# Patient Record
Sex: Female | Born: 1990 | Race: White | Hispanic: No | Marital: Single | Smoking: Never smoker
Health system: Southern US, Community
[De-identification: ages and names within clinical notes are randomized; demographics above are authoritative.]

## PROBLEM LIST (undated history)

## (undated) DIAGNOSIS — D497 Neoplasm of unspecified behavior of endocrine glands and other parts of nervous system: Secondary | ICD-10-CM

## (undated) HISTORY — PX: TYMPANOSTOMY TUBE PLACEMENT: SHX32

## (undated) HISTORY — PX: FOOT SURGERY: SHX648

## (undated) HISTORY — PX: NASAL SINUS SURGERY: SHX719

---

## 2018-05-20 ENCOUNTER — Encounter (HOSPITAL_COMMUNITY): Payer: Self-pay | Admitting: Emergency Medicine

## 2018-05-20 ENCOUNTER — Emergency Department (HOSPITAL_COMMUNITY)
Admission: EM | Admit: 2018-05-20 | Discharge: 2018-05-20 | Disposition: A | Discharge status: Home / Self Care | Payer: PRIVATE HEALTH INSURANCE | Attending: Emergency Medicine | Admitting: Emergency Medicine

## 2018-05-20 ENCOUNTER — Emergency Department (HOSPITAL_COMMUNITY): Payer: PRIVATE HEALTH INSURANCE

## 2018-05-20 DIAGNOSIS — G43809 Other migraine, not intractable, without status migrainosus: Secondary | ICD-10-CM | POA: Diagnosis not present

## 2018-05-20 DIAGNOSIS — Z86018 Personal history of other benign neoplasm: Secondary | ICD-10-CM | POA: Diagnosis not present

## 2018-05-20 DIAGNOSIS — R51 Headache: Secondary | ICD-10-CM | POA: Diagnosis present

## 2018-05-20 HISTORY — DX: Neoplasm of unspecified behavior of endocrine glands and other parts of nervous system: D49.7

## 2018-05-20 MED ORDER — METOCLOPRAMIDE HCL 10 MG PO TABS: 10.0000 mg | ORAL_TABLET | Freq: Four times a day (QID) | ORAL | 0 refills | Status: DC

## 2018-05-20 MED ORDER — DEXAMETHASONE SODIUM PHOSPHATE 10 MG/ML IJ SOLN
10.0000 mg | Freq: Once | INTRAMUSCULAR | Status: AC
  Administered 2018-05-20: 10 mg via INTRAVENOUS
  Filled 2018-05-20: qty 1

## 2018-05-20 MED ORDER — TOPIRAMATE 25 MG PO TABS: 25.0000 mg | ORAL_TABLET | Freq: Two times a day (BID) | ORAL | 0 refills | Status: DC

## 2018-05-20 MED ORDER — PROCHLORPERAZINE EDISYLATE 10 MG/2ML IJ SOLN
10.0000 mg | INTRAMUSCULAR | Status: AC
  Administered 2018-05-20: 10 mg via INTRAVENOUS
  Filled 2018-05-20: qty 2

## 2018-05-20 MED ORDER — KETOROLAC TROMETHAMINE 15 MG/ML IJ SOLN
15.0000 mg | Freq: Once | INTRAMUSCULAR | Status: AC
  Administered 2018-05-20: 15 mg via INTRAVENOUS
  Filled 2018-05-20: qty 1

## 2018-05-20 MED ORDER — SODIUM CHLORIDE 0.9 % IV BOLUS (SEPSIS)
1000.0000 mL | Freq: Once | INTRAVENOUS | Status: AC
  Administered 2018-05-20: 1000 mL via INTRAVENOUS

## 2018-05-20 NOTE — Discharge Instructions (Signed)
Your CT scan of your head was negative for any new or acute problem. Thank you for allowing me to care for you today. Please return to the emergency department if you have new or worsening symptoms. Take your medications as instructed.

## 2018-05-20 NOTE — ED Notes (Signed)
Patient transported to CT 

## 2018-05-20 NOTE — ED Triage Notes (Signed)
Pt c/o frontal migraine since Tuesday and blurred vision since Wed. 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 Pt is sensitive to sounds.

## 2018-05-20 NOTE — ED Notes (Signed)
ED Provider at bedside. 

## 2018-05-21 NOTE — ED Provider Notes (Signed)
[facility] DEPT Provider Note   CSN: [id number] Arrival date & time: 05/20/18  1842    History   Chief Complaint Chief Complaint  Patient presents with  . Migraine  . Blurred Vision    HPI Julie Crawford is a 28 y.o. female.     Patient is a 28 year old female with past medical history of a pituitary tumor, migraines who presents to the emergency department for migraine headache.  She reports that this is been going on for the last couple of days.  Reports that this is similar to migraines he has had in the past.  Reports that she used to see neurology for her migraines and for monitoring of her pituitary tumor but she has not seen them in some time because that was when she lived in [place].  Reports that she just moved to the area and needs a new neurologist.  Reports that the migraine is frontal and associated with photophobia, phonophobia and blurry vision.  Reports over-the-counter medications are not working.  Denies any numbness, tingling, weakness, slurred speech     Past Medical History:  Diagnosis Date  . Pituitary tumor     There are no active problems to display for this patient.   Past Surgical History:  Procedure Laterality Date  . FOOT SURGERY Right   . NASAL SINUS SURGERY    . TYMPANOSTOMY TUBE PLACEMENT       OB History   No obstetric history on file.      Home Medications    Prior to Admission medications   Medication Sig Start Date End Date Taking? Authorizing Provider  aspirin-acetaminophen-caffeine (EXCEDRIN MIGRAINE) [phone] MG tablet Take 2 tablets by mouth every 6 (six) hours as needed for headache or migraine.   Yes [provider]  metoCLOPramide (REGLAN) 10 MG tablet Take 1 tablet (10 mg total) by mouth every 6 (six) hours. 05/20/18   Alveria Apley, PA-C  topiramate (TOPAMAX) 25 MG tablet Take 1 tablet (25 mg total) by mouth 2 (two) times daily for 14 days. 05/20/18 06/03/18  Alveria Apley, PA-C      Family History No family history on file.  Social History Social History   Tobacco Use  . Smoking status: Never Smoker  . Smokeless tobacco: Never Used  Substance Use Topics  . Alcohol use: Never    Frequency: Never  . Drug use: Not on file     Allergies   Benadryl [diphenhydramine]; Zofran [ondansetron hcl]; and Sumatriptan succinate   Review of Systems Review of Systems  Constitutional: Negative for chills and fever.  HENT: Negative for ear pain and sore throat.   Eyes: Positive for photophobia and visual disturbance. Negative for pain, discharge, redness and itching.  Respiratory: Negative for cough and shortness of breath.   Cardiovascular: Negative for chest pain and palpitations.  Gastrointestinal: Negative for abdominal pain, diarrhea, nausea and vomiting.  Genitourinary: Negative for dysuria and hematuria.  Musculoskeletal: Negative for arthralgias and back pain.  Skin: Negative for color change and rash.  Neurological: Positive for headaches. Negative for dizziness, tremors, seizures, syncope, facial asymmetry, speech difficulty, weakness and light-headedness.  All other systems reviewed and are negative.    Physical Exam Updated Vital Signs BP 111/77 (BP Location: Right Arm)   Pulse 81   Temp 98.7 F (37.1 C) (Oral)   Resp 17   Ht 5\' 4"  (1.626 m)   Wt 70.3 kg   LMP 05/20/2018   SpO2 99%   BMI  26.61 kg/m   Physical Exam Vitals signs and nursing note reviewed.  Constitutional:      Appearance: Normal appearance.  HENT:     Head: Normocephalic.     Right Ear: Tympanic membrane normal.     Left Ear: Tympanic membrane normal.     Nose: Nose normal.     Mouth/Throat:     Mouth: Mucous membranes are moist.     Pharynx: Oropharynx is clear.  Eyes:     Conjunctiva/sclera: Conjunctivae normal.  Cardiovascular:     Rate and Rhythm: Normal rate and regular rhythm.  Pulmonary:     Effort: Pulmonary effort is normal.  Skin:    General: Skin is  dry.  Neurological:     General: No focal deficit present.     Mental Status: She is alert and oriented to person, place, and time.     Cranial Nerves: No cranial nerve deficit.     Sensory: No sensory deficit.     Motor: No weakness.     Coordination: Coordination normal.     Gait: Gait normal.     Deep Tendon Reflexes: Reflexes normal.  Psychiatric:        Mood and Affect: Mood normal.      ED Treatments / Results  Labs (all labs ordered are listed, but only abnormal results are displayed) Labs Reviewed - No data to display  EKG None  Radiology Ct Head Wo Contrast  Result Date: 05/20/2018 CLINICAL DATA:  Migraine headache. EXAM: CT HEAD WITHOUT CONTRAST TECHNIQUE: Contiguous axial images were obtained from the base of the skull through the vertex without intravenous contrast. COMPARISON:  None. FINDINGS: Brain: No acute intracranial abnormality. Specifically, no hemorrhage, hydrocephalus, mass lesion, acute infarction, or significant intracranial injury. Vascular: No hyperdense vessel or unexpected calcification. Skull: No acute calvarial abnormality. Sinuses/Orbits: Visualized paranasal sinuses and mastoids clear. Orbital soft tissues unremarkable. Other: None IMPRESSION: Normal study. Electronically Signed   By: Rolm Baptise M.D.   On: 05/20/2018 21:53    Procedures Procedures (including critical care time)  Medications Ordered in ED Medications  ketorolac (TORADOL) 15 MG/ML injection 15 mg (15 mg Intravenous Given 05/20/18 2118)  dexamethasone (DECADRON) injection 10 mg (10 mg Intravenous Given 05/20/18 2117)  sodium chloride 0.9 % bolus 1,000 mL (0 mLs Intravenous Stopped 05/20/18 2250)  prochlorperazine (COMPAZINE) injection 10 mg (10 mg Intravenous Given 05/20/18 2117)     Initial Impression / Assessment and Plan / ED Course  I have reviewed the triage vital signs and the nursing notes.  Pertinent labs & imaging results that were available during my care of the  patient were reviewed by me and considered in my medical decision making (see chart for details).  Clinical Course as of May 22 2307  Nancy Fetter May 20, 2018  2237 Patient is improved with fluids, toradol, compazine. CT negative.  There are no neurological findings on exam.  Will send home with medication for migraine and refer her to new neurologist.    [KM]    Clinical Course User Index [KM] Alveria Apley, PA-C         Final Clinical Impressions(s) / ED Diagnoses   Final diagnoses:  Other migraine without status migrainosus, not intractable  History of benign pituitary tumor    ED Discharge Orders         Ordered    metoCLOPramide (REGLAN) 10 MG tablet  Every 6 hours     05/20/18 2258    topiramate (TOPAMAX) 25 MG  tablet  2 times daily     05/20/18 2258           Kristine Royal 05/21/18 2310    Margette Fast, MD 05/22/18 1017

## 2018-05-21 NOTE — Progress Notes (Signed)
NEUROLOGY CONSULTATION NOTE  Julie Crawford MRN: [id number] DOB: 03-11-1991  Referring provider: Marda Stalker, MD (ED referral) Primary care provider: No PCP  Reason for consult:  migraines  HISTORY OF PRESENT ILLNESS: Julie Crawford is a 28 year old right-handed Caucasian woman with history of benign pituitary tumor who presents for migraines.  History supplemented by ED note.  She moved here from [place] in August.  Onset:  She started having migraines in 2010 in [place].  She had a 2 week intractable migraine.  Imaging demonstrated a benign pituitary tumor.  She underwent an LP which ruled out high intracranial pressure and meningitis.  She saw a neurosurgeon who did not perform surgery because he thought the risks were too high.  She was also followed by endocrinology, ophthalmology and neurology.  Since then, she has had a 3-4/10 migraine daily which would come and go.  She has had a constant intractable migraine for the past week.   Location:  Usually they are holocephalic but current migraine is center of frontal region. Quality:  Pressure and throbbing Intensity:  9/10 Aura:  no Premonitory Phase:  no Postdrome:  no Associated symptoms:  Photophobia, phonophobia, sometimes nausea.  She reports blurred/double vision.  She denies associated unilateral numbness or weakness. Duration:  constant Frequency:  constant Frequency of abortive medication: rarely Triggers:  None.  Unclear if hormonal, exertion Relieving factors:  none Activity:  aggravates  She was evaluated in the ED on 05/20/17 where CT head was performed, which was personally reviewed and was normal.  Current NSAIDS:  none Current analgesics:  Excedrin Migraine (1 or 2 days a week) Current triptans:  none Current ergotamine:  none Current anti-emetic:  none Current muscle relaxants:  none Current anti-anxiolytic:  none Current sleep aide:  none Current Antihypertensive medications:  none Current  Antidepressant medications:  none Current Anticonvulsant medications:  topiramate 25mg  twice daily (started on Sunday) Current anti-CGRP:  none Current Vitamins/Herbal/Supplements:  none Current Antihistamines/Decongestants:  none Other therapy:  none Hormone/birth control:  none Other medications:  none  Past NSAIDS:  Toradol, ibuprofen, naproxen Past analgesics:  Tylenol, maybe tramadol Past abortive triptans:  Sumatriptan (stiff neck, hives), Maxalt (also reaction) Past abortive ergotamine:  IV DHE in hospital Past muscle relaxants:  Unsure of names Past anti-emetic:  Reglan (face swelling and rash), Zofran (hives, anaphylaxis), promethazine (no side effect), compazine (possible side effect- tongue swelling) Past antihypertensive medications:  propranolol Past antidepressant medications:  nortriptyline Past anticonvulsant medications:  none Past anti-CGRP:  none Past vitamins/Herbal/Supplements:  none Past antihistamines/decongestants:  Benadryl (hives, anaphylaxis) Other past therapies:  none  Caffeine:  rarely Alcohol:  no Smoker:  no Diet:  60 oz water daily.  Tries to avoid soda.   Exercise:  Mild (over exertion causes headache Depression:  no; Anxiety:  no Other pain:  no Sleep hygiene:  varies Family history of headache:  no  PAST MEDICAL HISTORY: Past Medical History:  Diagnosis Date  . Pituitary tumor     PAST SURGICAL HISTORY: Past Surgical History:  Procedure Laterality Date  . FOOT SURGERY Right   . NASAL SINUS SURGERY    . TYMPANOSTOMY TUBE PLACEMENT      MEDICATIONS: Current Outpatient Medications on File Prior to Visit  Medication Sig Dispense Refill  . aspirin-acetaminophen-caffeine (EXCEDRIN MIGRAINE) 250-250-65 MG tablet Take 2 tablets by mouth every 6 (six) hours as needed for headache or migraine.    . metoCLOPramide (REGLAN) 10 MG tablet Take 1 tablet (10 mg total) by  mouth every 6 (six) hours. 30 tablet 0  . topiramate (TOPAMAX) 25 MG tablet  Take 1 tablet (25 mg total) by mouth 2 (two) times daily for 14 days. 28 tablet 0   No current facility-administered medications on file prior to visit.     ALLERGIES: Allergies  Allergen Reactions  . Benadryl [Diphenhydramine] Anaphylaxis  . Zofran [Ondansetron Hcl] Anaphylaxis  . Sumatriptan Succinate Hives    FAMILY HISTORY: No family history of headaches  SOCIAL HISTORY: Social History   Socioeconomic History  . Marital status: Single    Spouse name: Not on file  . Number of children: Not on file  . Years of education: Not on file  . Highest education level: Not on file  Occupational History  . Not on file  Social Needs  . Financial resource strain: Not on file  . Food insecurity:    Worry: Not on file    Inability: Not on file  . Transportation needs:    Medical: Not on file    Non-medical: Not on file  Tobacco Use  . Smoking status: Never Smoker  . Smokeless tobacco: Never Used  Substance and Sexual Activity  . Alcohol use: Never    Frequency: Never  . Drug use: Not on file  . Sexual activity: Not on file  Lifestyle  . Physical activity:    Days per week: Not on file    Minutes per session: Not on file  . Stress: Not on file  Relationships  . Social connections:    Talks on phone: Not on file    Gets together: Not on file    Attends religious service: Not on file    Active member of club or organization: Not on file    Attends meetings of clubs or organizations: Not on file    Relationship status: Not on file  . Intimate partner violence:    Fear of current or ex partner: Not on file    Emotionally abused: Not on file    Physically abused: Not on file    Forced sexual activity: Not on file  Other Topics Concern  . Not on file  Social History Narrative  . Not on file    REVIEW OF SYSTEMS: Constitutional: No fevers, chills, or sweats, no generalized fatigue, change in appetite Eyes: No visual changes, double vision, eye pain Ear, nose and  throat: No hearing loss, ear pain, nasal congestion, sore throat Cardiovascular: No chest pain, palpitations Respiratory:  No shortness of breath at rest or with exertion, wheezes GastrointestinaI: No nausea, vomiting, diarrhea, abdominal pain, fecal incontinence Genitourinary:  No dysuria, urinary retention or frequency Musculoskeletal:  No neck pain, back pain Integumentary: No rash, pruritus, skin lesions Neurological: as above Psychiatric: No depression, insomnia, anxiety Endocrine: No palpitations, fatigue, diaphoresis, mood swings, change in appetite, change in weight, increased thirst Hematologic/Lymphatic:  No purpura, petechiae. Allergic/Immunologic: no itchy/runny eyes, nasal congestion, recent allergic reactions, rashes  PHYSICAL EXAM: Blood pressure 108/74, pulse 85, height 5\' 4"  (1.626 m), weight 155 lb (70.3 kg), last menstrual period 05/20/2018, SpO2 99 %. General: No acute distress.  Patient appears well-groomed.   Head:  Normocephalic/atraumatic Eyes:  fundi examined but not visualized Neck: supple, no paraspinal tenderness, full range of motion Back: No paraspinal tenderness Heart: regular rate and rhythm Lungs: Clear to auscultation bilaterally. Vascular: No carotid bruits. Neurological Exam: Mental status: alert and oriented to person, place, and time, recent and remote memory intact, fund of knowledge intact, attention and concentration  intact, speech fluent and not dysarthric, language intact. Cranial nerves: CN I: not tested CN II: pupils equal, round and reactive to light, visual fields intact CN III, IV, VI:  full range of motion, no nystagmus, no ptosis CN V: facial sensation intact CN VII: upper and lower face symmetric CN VIII: hearing intact CN IX, X: gag intact, uvula midline CN XI: sternocleidomastoid and trapezius muscles intact CN XII: tongue midline Bulk & Tone: normal, no fasciculations. Motor:  5/5 throughout  Sensation:  temperature and  vibration sensation intact.  Deep Tendon Reflexes:  2+ throughout, toes downgoing.   Finger to nose testing:  Without dysmetria.   Heel to shin:  Without dysmetria.   Gait:  Normal station and stride.  Able to turn and tandem walk. Romberg negative.  IMPRESSION: 1.  Chronic migraine without aura, with status migrainosus, intractable 2.  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 MRI of brain with and without contrast with attention to pituitary gland 2. Refer to endocrinology and ophthalmology 3. To break current intractable migraine, prednisone taper.  4. For preventative management, continue to titrate topiramate to 50mg  twice daily 5.  For abortive therapy, will try Ubrelvy 50mg .  If ineffective, try 100mg .  Promethazine for nausea. 6.  Limit use of pain relievers to no more than 2 days out of week to prevent risk of rebound or medication-overuse headache. 7.  Keep headache diary 8.  Exercise, hydration, caffeine cessation, sleep hygiene, monitor for and avoid triggers 9.  Consider:  magnesium citrate 400mg  daily, riboflavin 400mg  daily, and coenzyme Q10 100mg  three times daily 10.  Follow up in 4 months  45 minutes spent face to face with patient, over 50% spent discussing management.  Metta Clines, DO

## 2018-05-23 ENCOUNTER — Encounter: Payer: Self-pay | Admitting: Neurology

## 2018-05-23 ENCOUNTER — Ambulatory Visit (INDEPENDENT_AMBULATORY_CARE_PROVIDER_SITE_OTHER): Payer: PRIVATE HEALTH INSURANCE | Admitting: Neurology

## 2018-05-23 ENCOUNTER — Other Ambulatory Visit (INDEPENDENT_AMBULATORY_CARE_PROVIDER_SITE_OTHER): Payer: PRIVATE HEALTH INSURANCE

## 2018-05-23 VITALS — BP 108/74 | HR 85 | Ht 64.0 in | Wt 155.0 lb

## 2018-05-23 DIAGNOSIS — D352 Benign neoplasm of pituitary gland: Secondary | ICD-10-CM | POA: Diagnosis not present

## 2018-05-23 DIAGNOSIS — Z79899 Other long term (current) drug therapy: Secondary | ICD-10-CM | POA: Diagnosis not present

## 2018-05-23 DIAGNOSIS — G43711 Chronic migraine without aura, intractable, with status migrainosus: Secondary | ICD-10-CM

## 2018-05-23 LAB — BASIC METABOLIC PANEL
BUN: 16 mg/dL (ref 6–23)
CO2: 26 mEq/L (ref 19–32)
Calcium: 9.3 mg/dL (ref 8.4–10.5)
Chloride: 105 mEq/L (ref 96–112)
Creatinine, Ser: 0.91 mg/dL (ref 0.40–1.20)
GFR: 74.22 mL/min (ref >60.00)
Glucose, Bld: 74 mg/dL (ref 70–99)
POTASSIUM: 3.8 meq/L (ref 3.5–5.1)
Sodium: 141 mEq/L (ref 135–145)

## 2018-05-23 MED ORDER — PREDNISONE 10 MG PO TABS: ORAL_TABLET | ORAL | 0 refills | Status: AC

## 2018-05-23 MED ORDER — PROMETHAZINE HCL 25 MG PO TABS: 25.0000 mg | ORAL_TABLET | Freq: Four times a day (QID) | ORAL | 2 refills | Status: AC | PRN

## 2018-05-23 MED ORDER — TOPIRAMATE 25 MG PO TABS: 50.0000 mg | ORAL_TABLET | Freq: Two times a day (BID) | ORAL | 3 refills | Status: AC

## 2018-05-23 MED ORDER — UBROGEPANT 50 MG PO TABS: 50.0000 mg | ORAL_TABLET | ORAL | 0 refills | Status: AC | PRN

## 2018-05-23 NOTE — Patient Instructions (Addendum)
1. To try and break current intractable migraine, take prednisone taper:  Take 6tabs x1day, then 5tabs x1day, then 4tabs x1day, then 3tabs x1day, then 2tabs x1day, then 1tab x1day, then STOP  2. For preventative management, we will continue to titrate up on topiramate.  On Sunday, start 1 tablet in morning and 2 tablets at bedtime for a week and then increase to 2 tablets twice daily 3.  For abortive therapy, try Ubrely.  Take 1 tablet.  May repeat in 2 hours if needed.  If not effective, contact us and I will prescribe the higher dose. 4.  Limit use of pain relievers to no more than 2 days out of week to prevent risk of rebound or medication-overuse headache. 5. Promethazine as needed for nausea 6.  Keep headache diary 7.  Exercise, hydration, caffeine cessation, sleep hygiene, monitor for and avoid triggers 8.  Consider:  magnesium citrate 400mg  daily, riboflavin 400mg  daily, and coenzyme Q10 100mg  three times daily 9.  We will get MRI of brain with and without contrast with attention to pituitary gland 10. I will refer you to endocrinology and ophthalmology 11. We will check a BMP lab today 12.  Follow up in 4 months.  We have sent a referral to [facility] for your MRI and they will call you directly to schedule your appt. They are located at [place]. If you need to contact them directly please call [phone].  Your provider has requested that you have labwork completed today. Please go to [facility] ([id number]) on the second floor of this building before leaving the office today. You do not need to check in. If you are not called within 15 minutes please check with the front desk.

## 2018-05-24 ENCOUNTER — Other Ambulatory Visit: Payer: Self-pay

## 2018-05-24 MED ORDER — UBROGEPANT 100 MG PO TABS: 100.0000 mg | ORAL_TABLET | ORAL | 3 refills | Status: AC | PRN

## 2018-05-29 ENCOUNTER — Telehealth: Payer: Self-pay

## 2018-05-29 NOTE — Telephone Encounter (Signed)
Patient stated she is returning a call from our office,. She received a phone call from you about the referral that was sent over. For her to call back to discuss the next step Please advise

## 2018-05-29 NOTE — Telephone Encounter (Signed)
Spoke with the patient-gave her the number to our medical records department so that she can get ROI faxed from Endocrinology in [place]

## 2018-06-03 ENCOUNTER — Ambulatory Visit
Admission: RE | Admit: 2018-06-03 | Discharge: 2018-06-03 | Disposition: A | Discharge status: Home / Self Care | Payer: PRIVATE HEALTH INSURANCE | Source: Ambulatory Visit | Attending: Neurology | Admitting: Neurology

## 2018-06-03 DIAGNOSIS — D352 Benign neoplasm of pituitary gland: Secondary | ICD-10-CM

## 2018-06-03 MED ORDER — GADOBENATE DIMEGLUMINE 529 MG/ML IV SOLN
9.0000 mL | Freq: Once | INTRAVENOUS | Status: AC | PRN
  Administered 2018-06-03: 9 mL via INTRAVENOUS

## 2018-06-19 ENCOUNTER — Telehealth: Payer: Self-pay

## 2018-06-19 NOTE — Telephone Encounter (Signed)
Called and LMOVM advising Pt to call for MRI results

## 2018-06-19 NOTE — Telephone Encounter (Signed)
-----   Message from Adam R Jaffe, DO sent at 06/04/2018  7:34 AM EDT ----- MRI shows benign cyst 

## 2018-06-27 ENCOUNTER — Telehealth: Payer: Self-pay

## 2018-06-27 NOTE — Telephone Encounter (Signed)
-----   Message from Pieter Partridge, DO sent at 06/04/2018  7:34 AM EDT ----- MRI shows benign cyst

## 2018-06-27 NOTE — Telephone Encounter (Signed)
Called and LMOVM advising of results and to call with any questions

## 2018-09-26 ENCOUNTER — Ambulatory Visit: Payer: PRIVATE HEALTH INSURANCE | Admitting: Neurology

## 2020-09-02 IMAGING — CT CT HEAD W/O CM
3 series · 15 of 47 positions shown, 18 images · non-contrast
Comparison: None.

CLINICAL DATA: Migraine headache.

EXAM:
CT HEAD WITHOUT CONTRAST
TECHNIQUE: Contiguous axial images were obtained from the base of the skull
through the vertex without intravenous contrast.

[Series 2: head wo · axial · 0.47mm/px · z∈[+230,+355]mm · 9 of 30 slices shown, 12 images]
[im 3/30  brain]
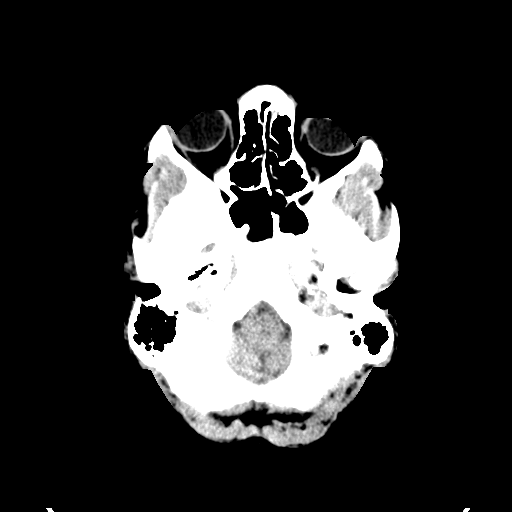
[im 3/30  bone]
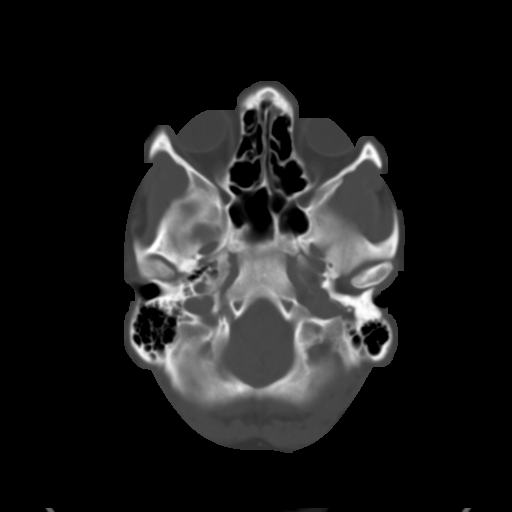
[im 6/30  brain]
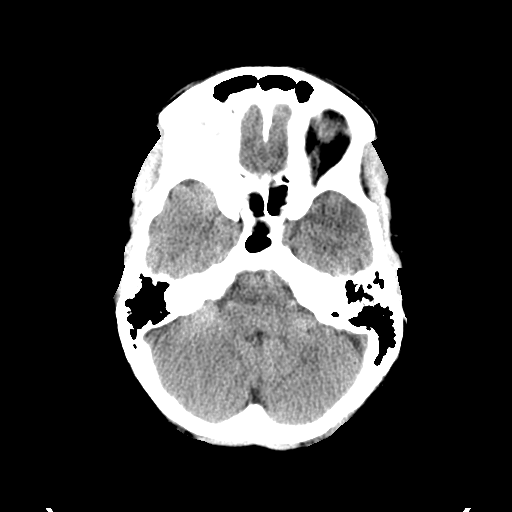
[im 9/30  brain]
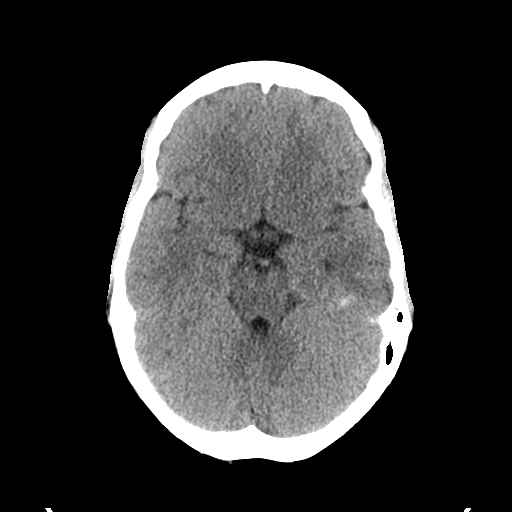
[im 12/30  brain]
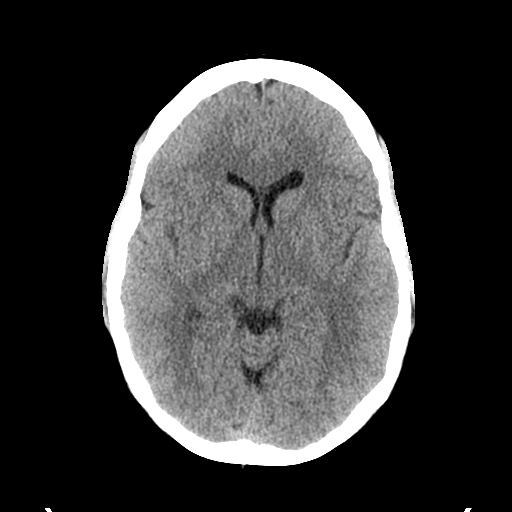
[im 16/30  brain]
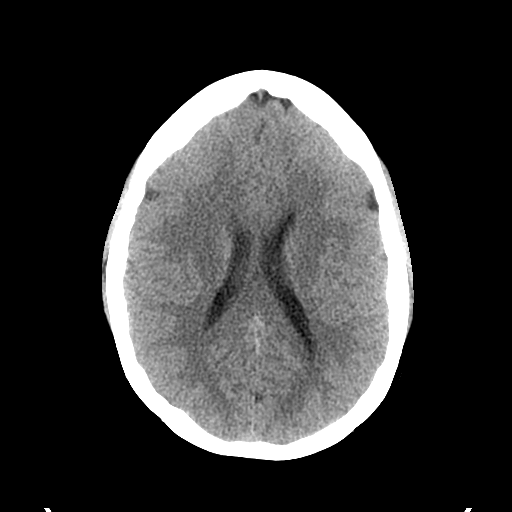
[im 16/30  bone]
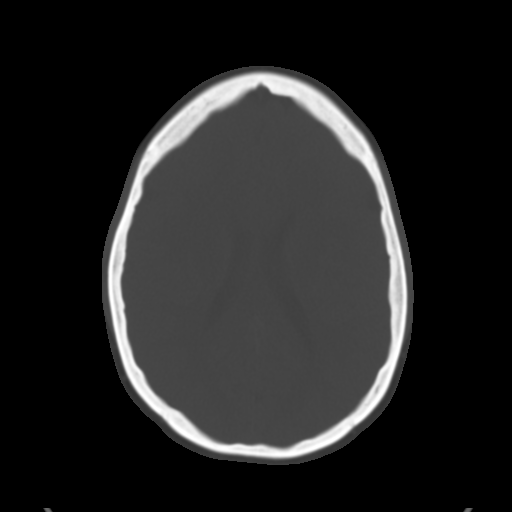
[im 19/30  brain]
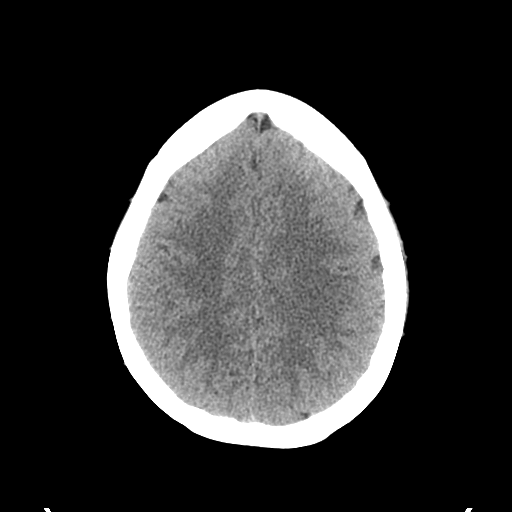
[im 22/30  brain]
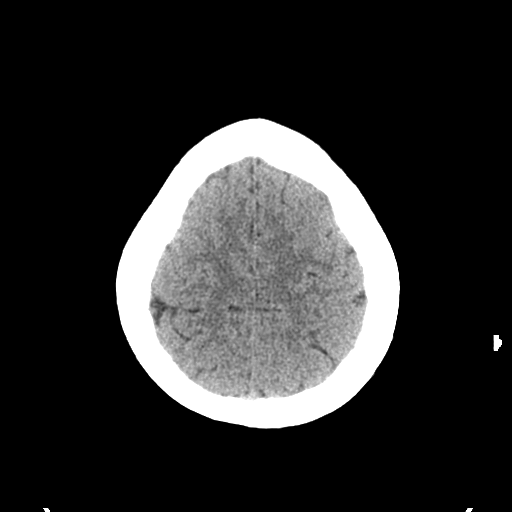
[im 25/30  brain]
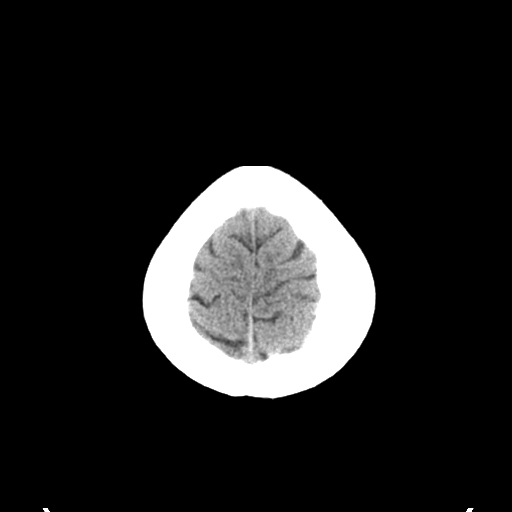
[im 28/30  brain]
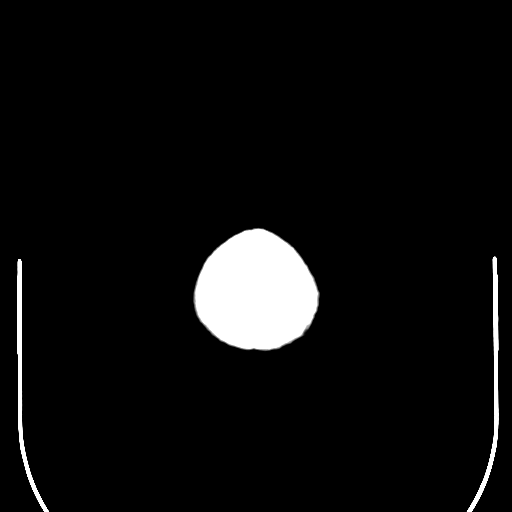
[im 28/30  bone]
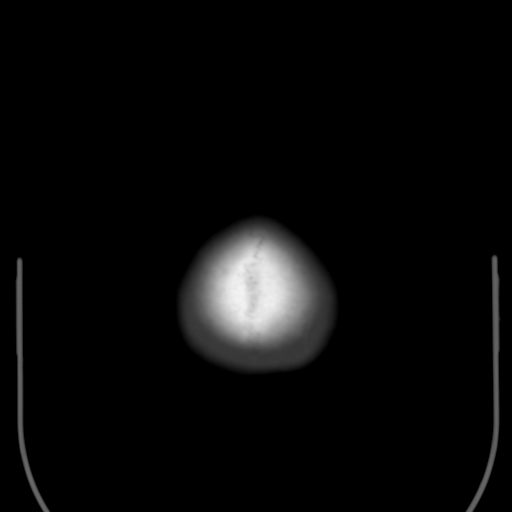

[Series 5: coronal soft tissue · coronal · 0.29mm/px · 3 of 70 slices shown]
[im 24/70  brain]
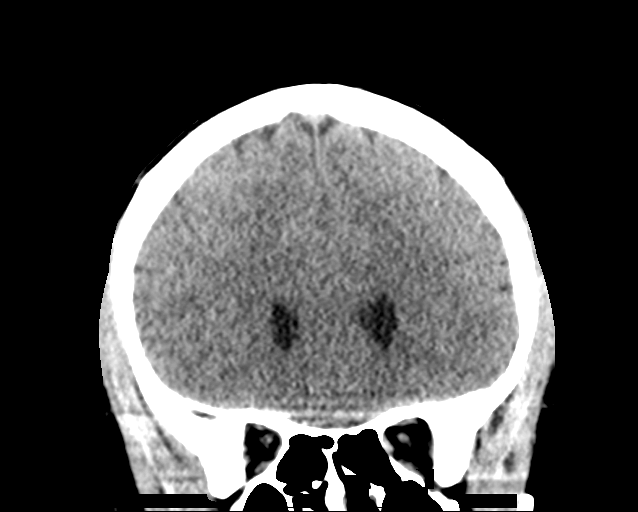
[im 31/70  brain]
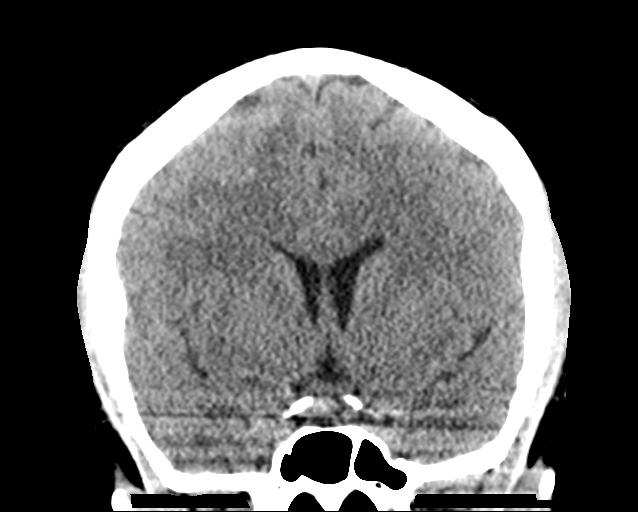
[im 39/70  brain]
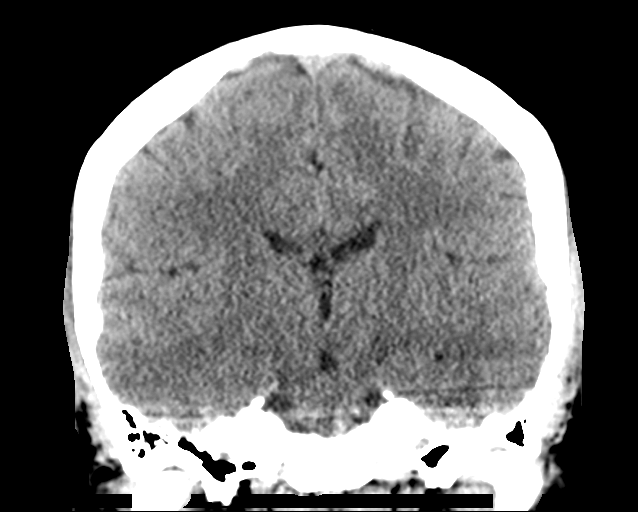

[Series 6: sagittal soft tissue · sagittal · 0.29mm/px · 3 of 58 slices shown]
[im 20/58  brain]
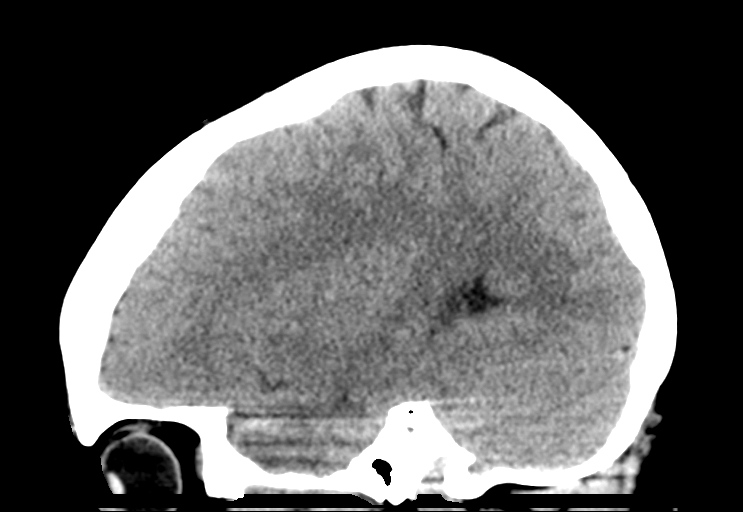
[im 29/58  brain]
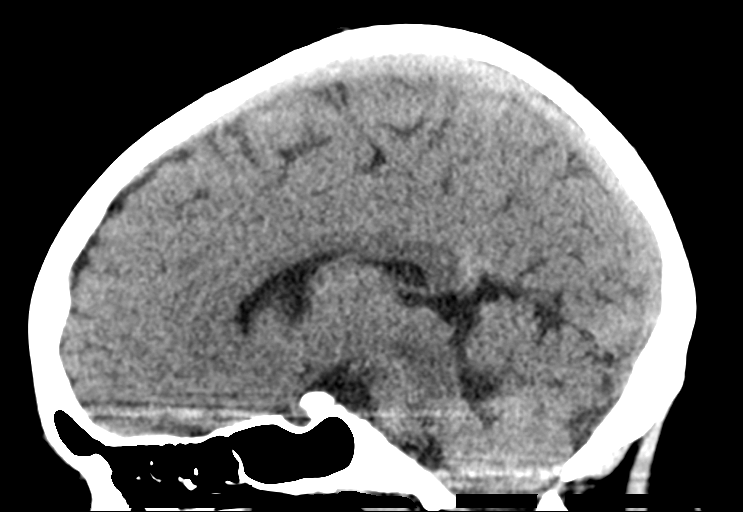
[im 39/58  brain]
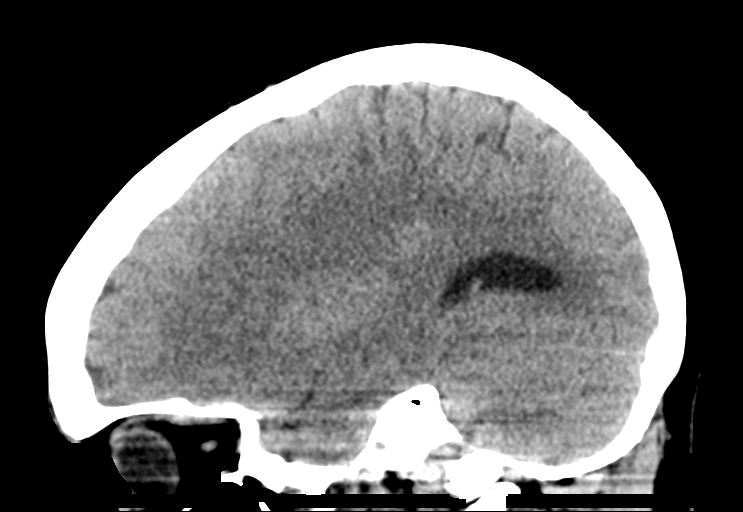

[15 of 47 positions shown; findings below may reference images not displayed]

FINDINGS: Brain: No acute intracranial abnormality. Specifically, no
hemorrhage, hydrocephalus, mass lesion, acute infarction, or
significant intracranial injury.

Vascular: No hyperdense vessel or unexpected calcification.

Skull: No acute calvarial abnormality.

Sinuses/Orbits: Visualized paranasal sinuses and mastoids clear.
Orbital soft tissues unremarkable.

Other: None
IMPRESSION: Normal study.
